# Patient Record
Sex: Female | Born: 1950 | Race: Black or African American | Hispanic: No | Marital: Married | Smoking: Never smoker
Health system: Southern US, Community
[De-identification: ages and names within clinical notes are randomized; demographics above are authoritative.]

---

## 1997-07-19 ENCOUNTER — Other Ambulatory Visit: Admission: RE | Admit: 1997-07-19 | Discharge: 1997-07-19 | Payer: Self-pay | Admitting: *Deleted

## 1998-06-25 ENCOUNTER — Other Ambulatory Visit: Admission: RE | Admit: 1998-06-25 | Discharge: 1998-06-25 | Payer: Self-pay | Admitting: *Deleted

## 2001-08-23 ENCOUNTER — Other Ambulatory Visit: Admission: RE | Admit: 2001-08-23 | Discharge: 2001-08-23 | Payer: Self-pay | Admitting: *Deleted

## 2002-09-05 ENCOUNTER — Other Ambulatory Visit: Admission: RE | Admit: 2002-09-05 | Discharge: 2002-09-05 | Payer: Self-pay | Admitting: *Deleted

## 2004-02-27 ENCOUNTER — Ambulatory Visit: Payer: Self-pay | Admitting: Internal Medicine

## 2004-02-28 ENCOUNTER — Encounter: Admission: RE | Admit: 2004-02-28 | Discharge: 2004-02-28 | Payer: Self-pay | Admitting: Internal Medicine

## 2004-07-02 ENCOUNTER — Ambulatory Visit: Payer: Self-pay | Admitting: Internal Medicine

## 2004-07-16 ENCOUNTER — Ambulatory Visit: Payer: Self-pay | Admitting: Internal Medicine

## 2005-07-28 ENCOUNTER — Emergency Department (HOSPITAL_COMMUNITY): Admission: EM | Admit: 2005-07-28 | Discharge: 2005-07-28 | Payer: Self-pay | Admitting: Emergency Medicine

## 2008-09-25 ENCOUNTER — Ambulatory Visit (HOSPITAL_COMMUNITY): Admission: RE | Admit: 2008-09-25 | Discharge: 2008-09-25 | Payer: Self-pay | Admitting: Orthopedic Surgery

## 2010-04-30 ENCOUNTER — Ambulatory Visit (INDEPENDENT_AMBULATORY_CARE_PROVIDER_SITE_OTHER): Payer: Self-pay | Admitting: Family Medicine

## 2010-04-30 DIAGNOSIS — Z713 Dietary counseling and surveillance: Secondary | ICD-10-CM

## 2013-01-03 ENCOUNTER — Other Ambulatory Visit (HOSPITAL_COMMUNITY)
Admission: RE | Admit: 2013-01-03 | Discharge: 2013-01-03 | Disposition: A | Discharge status: Home / Self Care | Payer: 59 | Source: Ambulatory Visit | Attending: Family Medicine | Admitting: Family Medicine

## 2013-01-03 ENCOUNTER — Other Ambulatory Visit: Payer: Self-pay | Admitting: Family Medicine

## 2013-01-03 DIAGNOSIS — Z1151 Encounter for screening for human papillomavirus (HPV): Secondary | ICD-10-CM | POA: Insufficient documentation

## 2013-01-03 DIAGNOSIS — Z124 Encounter for screening for malignant neoplasm of cervix: Secondary | ICD-10-CM | POA: Insufficient documentation

## 2013-01-17 ENCOUNTER — Encounter: Payer: Self-pay | Admitting: Internal Medicine

## 2013-01-17 ENCOUNTER — Other Ambulatory Visit (HOSPITAL_COMMUNITY): Payer: Self-pay | Admitting: Family Medicine

## 2013-01-17 ENCOUNTER — Ambulatory Visit (HOSPITAL_COMMUNITY): Payer: 59 | Attending: Internal Medicine | Admitting: Radiology

## 2013-01-17 DIAGNOSIS — I079 Rheumatic tricuspid valve disease, unspecified: Secondary | ICD-10-CM | POA: Insufficient documentation

## 2013-01-17 DIAGNOSIS — I359 Nonrheumatic aortic valve disorder, unspecified: Secondary | ICD-10-CM

## 2013-01-17 DIAGNOSIS — R011 Cardiac murmur, unspecified: Secondary | ICD-10-CM | POA: Insufficient documentation

## 2013-01-17 DIAGNOSIS — E785 Hyperlipidemia, unspecified: Secondary | ICD-10-CM | POA: Insufficient documentation

## 2013-01-17 NOTE — Progress Notes (Signed)
Echocardiogram performed.  

## 2013-01-26 ENCOUNTER — Telehealth: Payer: Self-pay | Admitting: Cardiology

## 2013-01-26 NOTE — Telephone Encounter (Signed)
New problem:  Pt states she is a patient of Dr. Anne Fu and she would like someone to call her with her echo results.

## 2013-01-26 NOTE — Telephone Encounter (Signed)
Echo results were reviewed with pt/ questions were asked and answered. Pt verbalized understanding.

## 2013-07-30 ENCOUNTER — Encounter: Payer: 59 | Attending: Family Medicine | Admitting: *Deleted

## 2013-07-30 DIAGNOSIS — E669 Obesity, unspecified: Secondary | ICD-10-CM | POA: Insufficient documentation

## 2013-07-30 DIAGNOSIS — Z713 Dietary counseling and surveillance: Secondary | ICD-10-CM | POA: Insufficient documentation

## 2013-07-30 NOTE — Progress Notes (Signed)
Patient was seen on 07/30/2013 for the Weight Loss Class at the [facility]. The following learning objectives were met by the patient during this class:   Describe healthy choices in each food group  Describe portion size of foods  Use plate method for meal planning  Demonstrate how to read Nutrition Facts food label  Set realistic goals for weight loss, diet changes, and physical activity.   Goals:  1. Make healthy food choices in each food group.  2. Reduce portion size of foods.  3. Increase fruit and vegetable intake.  4. Use plate method for meal planning.  5. Increase physical activity.   Handouts given:  1. Weight loss tips 2. Meal plan/portion card 2. Plate method  2. Food label handout

## 2015-03-07 MED FILL — TIMOLOL 0.5% EYE DROPS: 0.5 | 25 days supply | Qty: 5 | Fill #1

## 2015-03-07 MED FILL — TRAVATAN Z 0.004% EYE DROP: 0.004 | 50 days supply | Qty: 5 | Fill #2

## 2015-04-09 MED FILL — TIMOLOL 0.5% EYE DROPS: 0.5 | 25 days supply | Qty: 5 | Fill #0

## 2015-05-18 MED FILL — TIMOLOL 0.5% EYE DROPS: 0.5 | 25 days supply | Qty: 5 | Fill #1

## 2015-06-01 DIAGNOSIS — H401131 Primary open-angle glaucoma, bilateral, mild stage: Secondary | ICD-10-CM | POA: Diagnosis not present

## 2015-06-01 DIAGNOSIS — H26493 Other secondary cataract, bilateral: Secondary | ICD-10-CM | POA: Diagnosis not present

## 2015-06-01 DIAGNOSIS — Z961 Presence of intraocular lens: Secondary | ICD-10-CM | POA: Diagnosis not present

## 2015-06-15 MED FILL — TIMOLOL 0.5% EYE DROPS: 0.5 | 25 days supply | Qty: 5 | Fill #2

## 2015-06-15 MED FILL — TRAVATAN Z 0.004% EYE DROP: 0.004 | 50 days supply | Qty: 5 | Fill #3

## 2015-07-06 DIAGNOSIS — H0289 Other specified disorders of eyelid: Secondary | ICD-10-CM | POA: Diagnosis not present

## 2015-07-06 DIAGNOSIS — H401131 Primary open-angle glaucoma, bilateral, mild stage: Secondary | ICD-10-CM | POA: Diagnosis not present

## 2015-07-26 ENCOUNTER — Ambulatory Visit (INDEPENDENT_AMBULATORY_CARE_PROVIDER_SITE_OTHER): Payer: 59

## 2015-07-26 ENCOUNTER — Encounter: Payer: Self-pay | Admitting: Podiatry

## 2015-07-26 ENCOUNTER — Ambulatory Visit (INDEPENDENT_AMBULATORY_CARE_PROVIDER_SITE_OTHER): Payer: 59 | Admitting: Podiatry

## 2015-07-26 VITALS — BP 150/83 | HR 84 | Resp 16 | Ht 60.0 in | Wt 158.0 lb

## 2015-07-26 DIAGNOSIS — M6740 Ganglion, unspecified site: Secondary | ICD-10-CM

## 2015-07-26 DIAGNOSIS — M79671 Pain in right foot: Secondary | ICD-10-CM | POA: Diagnosis not present

## 2015-07-26 NOTE — Progress Notes (Signed)
Subjective:     Patient ID: Erika Sanchez, female   DOB: 06-Jul-1950, 65 y.o.   MRN: [id number]  HPI patient points to the fourth digit right foot stating that there is a cyst that's been present for a little while and larger over the last several weeks. Concerned about what it may be and what could be done   Review of Systems  All other systems reviewed and are negative.      Objective:   Physical Exam  Constitutional: She is oriented to person, place, and time.  Cardiovascular: Intact distal pulses.   Musculoskeletal: Normal range of motion.  Neurological: She is oriented to person, place, and time.  Skin: Skin is warm.  Nursing note and vitals reviewed.  neurovascular status intact with muscle strength adequate and range of motion within normal limits. Patient's noted to have an inflamed area on the interphalangeal joint distal fourth toe right medial side that measures about 3 x 3 mm with fluid buildup noted. Patient is noted to have good digital perfusion and is well oriented 3     Assessment:     Probable mucoid cyst with ganglionic-like fluid fourth digit right foot    Plan:     H&P and x-ray reviewed. Infiltrated the right fourth toe 60 mg Xylocaine Marcaine mixture and after appropriate numbness aspirated the area getting out a small amount of gelatinous fluid and injected with a quarter cc of dexamethasone. Went ahead and applied compression and instructed on compressing this the next 4 weeks and reappoint to recheck  X-ray report indicates that there is slight narrowing of the interphalangeal joint fourth digit right but good alignment

## 2015-07-26 NOTE — Progress Notes (Signed)
   Subjective:    Patient ID: Erika Sanchez, female    DOB: [birth year], 65 y.o.   MRN: [id number]  HPI Chief Complaint  Patient presents with  . Toe Pain    Right foot; 4th toe; pt stated, "has hard nodule on top of toe; thinks might be a cyst"; x2 weeks     Review of Systems  All other systems reviewed and are negative.      Objective:   Physical Exam        Assessment & Plan:

## 2015-07-27 ENCOUNTER — Telehealth: Payer: Self-pay | Admitting: *Deleted

## 2015-07-27 NOTE — Telephone Encounter (Addendum)
Pt states she is having severe throbbing after the procedure Dr. Charlsie Merlesegal performed yesterday.  Dr. Charlsie Merlesegal states remove the compression, and take OTC antiinflammatory medication or Tylenol as directed if she tolerates.  Pt states understanding.

## 2015-08-03 MED FILL — TRAVATAN Z 0.004% EYE DROP: 0.004 | 24 days supply | Qty: 3 | Fill #4

## 2015-08-03 MED FILL — TIMOLOL 0.5% EYE DROPS: 0.5 | 25 days supply | Qty: 5 | Fill #3

## 2015-08-10 ENCOUNTER — Telehealth: Payer: Self-pay | Admitting: *Deleted

## 2015-08-10 NOTE — Telephone Encounter (Signed)
Pt states she had an I & D on 4th toe and it is still numb. Informed pt that was not unusual in the lesser toes, and asked how the toe looked. Pt states looked okay.  I told her since she had an infection it would be best to be checked again, and transferred to scheduler.

## 2015-08-16 ENCOUNTER — Ambulatory Visit (INDEPENDENT_AMBULATORY_CARE_PROVIDER_SITE_OTHER): Payer: 59 | Admitting: Podiatry

## 2015-08-16 ENCOUNTER — Encounter: Payer: Self-pay | Admitting: Podiatry

## 2015-08-16 VITALS — BP 148/90 | HR 92 | Resp 12

## 2015-08-16 DIAGNOSIS — M6740 Ganglion, unspecified site: Secondary | ICD-10-CM | POA: Diagnosis not present

## 2015-08-16 DIAGNOSIS — M79671 Pain in right foot: Secondary | ICD-10-CM

## 2015-08-17 NOTE — Progress Notes (Signed)
Subjective:     Patient ID: Erika Sanchez, female   DOB: [birth year], 65 y.o.   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 If symptoms persist over the next 3 months she will be seen back

## 2015-08-31 MED FILL — TIMOLOL 0.5% EYE DROPS: 0.5 | 25 days supply | Qty: 5 | Fill #0

## 2015-08-31 MED FILL — TRAVATAN Z 0.004% EYE DROP: 0.004 | 25 days supply | Qty: 3 | Fill #0

## 2015-10-01 MED FILL — TRAVATAN Z 0.004% EYE DROP: 0.004 | 25 days supply | Qty: 3 | Fill #1

## 2015-10-01 MED FILL — TIMOLOL 0.5% EYE DROPS: 0.5 | 25 days supply | Qty: 5 | Fill #1

## 2015-10-31 MED FILL — TRAVATAN Z 0.004% EYE DROP: 0.004 | 50 days supply | Qty: 5 | Fill #2

## 2015-10-31 MED FILL — TIMOLOL 0.5% EYE DROPS: 0.5 | 50 days supply | Qty: 10 | Fill #2

## 2015-12-28 MED FILL — TIMOLOL 0.5% EYE DROPS: 0.5 | 50 days supply | Qty: 10 | Fill #3

## 2015-12-31 DIAGNOSIS — L239 Allergic contact dermatitis, unspecified cause: Secondary | ICD-10-CM | POA: Diagnosis not present

## 2015-12-31 MED FILL — TRAVATAN Z 0.004% EYE DROP: 0.004 | 50 days supply | Qty: 5 | Fill #3

## 2016-03-07 MED FILL — TIMOLOL 0.5% EYE DROPS: 0.5 | 50 days supply | Qty: 10 | Fill #4

## 2016-03-14 MED FILL — LATANOPROST 0.005% EYE DRP: 0.005 | 25 days supply | Qty: 3 | Fill #0

## 2016-04-09 MED FILL — LATANOPROST 0.005% EYE DRP: 0.005 | 23 days supply | Qty: 3 | Fill #0

## 2016-04-28 MED FILL — TIMOLOL 0.5% EYE DROPS: 0.5 | 50 days supply | Qty: 10 | Fill #0

## 2016-04-28 MED FILL — LATANOPROST 0.005% EYE DRP: 0.005 | 23 days supply | Qty: 3 | Fill #1

## 2016-06-04 MED FILL — LATANOPROST 0.005% EYE DRP: 0.005 | 23 days supply | Qty: 3 | Fill #2

## 2016-06-24 MED FILL — LATANOPROST 0.005% EYE DRP: 0.005 | 23 days supply | Qty: 3 | Fill #3

## 2016-06-24 MED FILL — TIMOLOL 0.5% EYE DROPS: 0.5 | 50 days supply | Qty: 10 | Fill #1

## 2016-07-27 MED FILL — LATANOPROST 0.005% EYE DRP: 0.005 | 25 days supply | Qty: 3 | Fill #4

## 2016-08-14 MED FILL — TIMOLOL 0.5% EYE DROPS: 0.5 | 50 days supply | Qty: 10 | Fill #2

## 2016-08-30 MED FILL — LATANOPROST 0.005% EYE DRP: 0.005 | 25 days supply | Qty: 3 | Fill #5

## 2016-09-24 MED FILL — LATANOPROST 0.005% EYE DRP: 0.005 | 25 days supply | Qty: 3 | Fill #6

## 2016-09-24 MED FILL — TIMOLOL 0.5% EYE DROPS: 0.5 | 50 days supply | Qty: 10 | Fill #3

## 2016-10-29 MED FILL — LATANOPROST 0.005% EYE DRP: 0.005 | 25 days supply | Qty: 3 | Fill #0

## 2016-11-18 MED FILL — TIMOLOL 0.5% EYE DROPS: 0.5 | 50 days supply | Qty: 10 | Fill #4

## 2016-11-25 MED FILL — LATANOPROST 0.005% EYE DRP: 0.005 | 25 days supply | Qty: 3 | Fill #1

## 2016-12-26 MED FILL — LATANOPROST 0.005% EYE DRP: 0.005 | 25 days supply | Qty: 3 | Fill #2

## 2017-01-07 MED FILL — TIMOLOL 0.5% EYE DROPS: 0.5 | 50 days supply | Qty: 10 | Fill #5

## 2017-01-27 MED FILL — LATANOPROST 0.005% EYE DRP: 0.005 | 25 days supply | Qty: 3 | Fill #3

## 2017-02-23 MED FILL — LATANOPROST 0.005% EYE DRP: 0.005 | 25 days supply | Qty: 3 | Fill #4

## 2017-02-23 MED FILL — TIMOLOL 0.5% EYE DROPS: 0.5 | 50 days supply | Qty: 10 | Fill #6

## 2017-03-24 MED FILL — LATANOPROST 0.005% EYE DRP: 0.005 | 25 days supply | Qty: 3 | Fill #5

## 2017-04-22 MED FILL — LATANOPROST 0.005% EYE DRP: 0.005 | 25 days supply | Qty: 3 | Fill #6

## 2017-05-11 MED FILL — LATANOPROST 0.005% EYE DRP: 0.005 | 25 days supply | Qty: 3 | Fill #0

## 2017-06-09 MED FILL — LATANOPROST 0.005% EYE DRP: 0.005 | 25 days supply | Qty: 3 | Fill #1

## 2017-07-07 MED FILL — LATANOPROST 0.005% EYE DRP: 0.005 | 25 days supply | Qty: 3 | Fill #2

## 2017-08-06 MED FILL — LATANOPROST 0.005% EYE DRP: 0.005 | 25 days supply | Qty: 3 | Fill #3

## 2017-09-01 MED FILL — LATANOPROST 0.005% EYE DRP: 0.005 | 25 days supply | Qty: 3 | Fill #4

## 2017-09-29 MED FILL — LATANOPROST 0.005% EYE DRP: 0.005 | 25 days supply | Qty: 3 | Fill #5

## 2017-10-30 MED FILL — LATANOPROST 0.005% EYE DRP: 0.005 | 25 days supply | Qty: 3 | Fill #6

## 2017-11-10 DIAGNOSIS — H401122 Primary open-angle glaucoma, left eye, moderate stage: Secondary | ICD-10-CM | POA: Diagnosis not present

## 2018-01-16 ENCOUNTER — Encounter (HOSPITAL_COMMUNITY): Payer: Self-pay

## 2018-01-16 ENCOUNTER — Ambulatory Visit (HOSPITAL_COMMUNITY)
Admission: EM | Admit: 2018-01-16 | Discharge: 2018-01-16 | Disposition: A | Discharge status: Home / Self Care | Payer: Medicare HMO | Attending: Family Medicine | Admitting: Family Medicine

## 2018-01-16 DIAGNOSIS — H9311 Tinnitus, right ear: Secondary | ICD-10-CM

## 2018-01-16 NOTE — ED Triage Notes (Signed)
Pt presents with a humming/buzzing sound in right ear, she believes may be an insect; no complaints of pain.

## 2018-01-16 NOTE — Discharge Instructions (Addendum)
If this continues, you may want to call an ear, nose, and throat (ENT) specialist as well as an audiologist for further workup.

## 2018-02-09 NOTE — ED Provider Notes (Addendum)
  [facility]   [id number] 01/16/18 Arrival Time: 1433  ASSESSMENT & PLAN:  1. Subjective tinnitus, right    Normal ear exam. No signs of foreign body, infection, or injury seen. Discussed. Recommend that she f/u with ENT/audiology. She will self-schedule. . May f/u with PCP or here as needed.  Reviewed expectations re: course of current medical issues. Questions answered. Outlined signs and symptoms indicating need for more acute intervention. Patient verbalized understanding. After Visit Summary given.   SUBJECTIVE: History from: patient.  Erika NearingMinnie W Sanchez is a 67 y.o. female who presents with complaint of "a humming or ringing in my right ear"; without drainage or pain; without bleeding. Onset gradual, unsure of when this started; notices it randomly. Recent cold symptoms: none. Fever: no. Overall normal PO intake without n/v. Sick contacts: no. OTC treatment: none. No new medications since this has started. No foreign bodies inserted into ears. No specific hearing loss or sensitivity to loud sounds reported.  Social History   Tobacco Use  Smoking Status Never Smoker   ROS: As per HPI.   OBJECTIVE:  Vitals:   01/16/18 1514  BP: 137/73  Pulse: 75  Resp: 20  Temp: 98 F (36.7 C)  TempSrc: Oral  SpO2: 98%     General appearance: alert; appears fatigued Ear Canal: normal bilaterally TM: bilateral: with normal appearance Neck: supple without LAD Lungs: unlabored respirations, symmetrical air entry; cough: absent; no respiratory distress Skin: warm and dry Psychological: alert and cooperative; normal mood and affect  Allergies  Allergen Reactions  . Keflex [Cephalexin] Hives  . Sulfa Antibiotics Hives   FH: no hearing problems reported  Social History   Socioeconomic History  . Marital status: Married    Spouse name: Not on file  . Number of children: Not on file  . Years of education: Not on file  . Highest education level: Not on file    Occupational History  . Not on file  Social Needs  . Financial resource strain: Not on file  . Food insecurity:    Worry: Not on file    Inability: Not on file  . Transportation needs:    Medical: Not on file    Non-medical: Not on file  Tobacco Use  . Smoking status: Never Smoker  Substance and Sexual Activity  . Alcohol use: Not on file  . Drug use: Not on file  . Sexual activity: Not on file  Lifestyle  . Physical activity:    Days per week: Not on file    Minutes per session: Not on file  . Stress: Not on file  Relationships  . Social connections:    Talks on phone: Not on file    Gets together: Not on file    Attends religious service: Not on file    Active member of club or organization: Not on file    Attends meetings of clubs or organizations: Not on file    Relationship status: Not on file  . Intimate partner violence:    Fear of current or ex partner: Not on file    Emotionally abused: Not on file    Physically abused: Not on file    Forced sexual activity: Not on file  Other Topics Concern  . Not on file  Social History Narrative  . Not on file            Mardella LaymanHagler, Cylah Fannin, MD 02/09/18 [id number]    Mardella LaymanHagler, Camilla Skeen, MD 02/09/18 [phone]

## 2018-03-02 DIAGNOSIS — H401111 Primary open-angle glaucoma, right eye, mild stage: Secondary | ICD-10-CM | POA: Diagnosis not present

## 2018-03-02 DIAGNOSIS — H401122 Primary open-angle glaucoma, left eye, moderate stage: Secondary | ICD-10-CM | POA: Diagnosis not present

## 2018-05-19 DIAGNOSIS — R5383 Other fatigue: Secondary | ICD-10-CM | POA: Diagnosis not present

## 2018-05-19 DIAGNOSIS — Z79899 Other long term (current) drug therapy: Secondary | ICD-10-CM | POA: Diagnosis not present

## 2018-05-19 DIAGNOSIS — E7800 Pure hypercholesterolemia, unspecified: Secondary | ICD-10-CM | POA: Diagnosis not present

## 2018-05-19 DIAGNOSIS — I499 Cardiac arrhythmia, unspecified: Secondary | ICD-10-CM | POA: Diagnosis not present

## 2018-06-17 DIAGNOSIS — I499 Cardiac arrhythmia, unspecified: Secondary | ICD-10-CM | POA: Diagnosis not present

## 2018-07-02 DIAGNOSIS — I499 Cardiac arrhythmia, unspecified: Secondary | ICD-10-CM | POA: Diagnosis not present

## 2018-07-02 DIAGNOSIS — R002 Palpitations: Secondary | ICD-10-CM | POA: Diagnosis not present

## 2018-07-16 DIAGNOSIS — I499 Cardiac arrhythmia, unspecified: Secondary | ICD-10-CM | POA: Diagnosis not present

## 2020-06-13 DIAGNOSIS — M170 Bilateral primary osteoarthritis of knee: Secondary | ICD-10-CM | POA: Diagnosis not present

## 2020-06-25 DIAGNOSIS — M170 Bilateral primary osteoarthritis of knee: Secondary | ICD-10-CM | POA: Diagnosis not present

## 2020-06-25 DIAGNOSIS — M7052 Other bursitis of knee, left knee: Secondary | ICD-10-CM | POA: Diagnosis not present

## 2020-06-25 DIAGNOSIS — M6281 Muscle weakness (generalized): Secondary | ICD-10-CM | POA: Diagnosis not present

## 2020-06-28 DIAGNOSIS — M25562 Pain in left knee: Secondary | ICD-10-CM | POA: Diagnosis not present

## 2020-07-06 DIAGNOSIS — M25562 Pain in left knee: Secondary | ICD-10-CM | POA: Diagnosis not present

## 2020-07-16 DIAGNOSIS — M25562 Pain in left knee: Secondary | ICD-10-CM | POA: Diagnosis not present

## 2020-07-19 DIAGNOSIS — M25561 Pain in right knee: Secondary | ICD-10-CM | POA: Diagnosis not present

## 2020-07-19 DIAGNOSIS — M25562 Pain in left knee: Secondary | ICD-10-CM | POA: Diagnosis not present

## 2020-07-24 ENCOUNTER — Other Ambulatory Visit: Payer: Self-pay

## 2020-07-24 ENCOUNTER — Ambulatory Visit: Payer: Medicare HMO | Attending: Internal Medicine

## 2020-07-24 ENCOUNTER — Other Ambulatory Visit (HOSPITAL_BASED_OUTPATIENT_CLINIC_OR_DEPARTMENT_OTHER): Payer: Self-pay

## 2020-07-24 DIAGNOSIS — M25562 Pain in left knee: Secondary | ICD-10-CM | POA: Diagnosis not present

## 2020-07-24 DIAGNOSIS — Z23 Encounter for immunization: Secondary | ICD-10-CM

## 2020-07-24 MED ORDER — COVID-19 MRNA VACC (MODERNA) 100 MCG/0.5ML IM SUSP
INTRAMUSCULAR | 0 refills | Status: AC
  Filled 2020-07-24: qty 0.25, 1d supply, fill #0

## 2020-07-24 NOTE — Progress Notes (Signed)
   Covid-19 Vaccination Clinic  Name:  Erika Sanchez    MRN: [id number] DOB: [birth year]  07/24/2020  [name] was observed post Covid-19 immunization for 15 minutes without incident. She was provided with Vaccine Information Sheet and instruction to access the V-Safe system.   [name] was instructed to call 911 with any severe reactions post vaccine: Marland Kitchen Difficulty breathing  . Swelling of face and throat  . A fast heartbeat  . A bad rash all over body  . Dizziness and weakness   Immunizations Administered    Name Date Dose VIS Date Route   Moderna Covid-19 Booster Vaccine 07/24/2020  2:33 PM 0.25 mL 12/14/2019 Intramuscular   Manufacturer: Moderna   Lot: [id number]   NDC: 49675-916-38

## 2020-09-27 DIAGNOSIS — Z131 Encounter for screening for diabetes mellitus: Secondary | ICD-10-CM | POA: Diagnosis not present

## 2020-09-27 DIAGNOSIS — Z0000 Encounter for general adult medical examination without abnormal findings: Secondary | ICD-10-CM | POA: Diagnosis not present

## 2020-09-27 DIAGNOSIS — E559 Vitamin D deficiency, unspecified: Secondary | ICD-10-CM | POA: Diagnosis not present

## 2020-09-27 DIAGNOSIS — Z79899 Other long term (current) drug therapy: Secondary | ICD-10-CM | POA: Diagnosis not present

## 2020-09-27 DIAGNOSIS — R300 Dysuria: Secondary | ICD-10-CM | POA: Diagnosis not present

## 2020-09-27 DIAGNOSIS — Z113 Encounter for screening for infections with a predominantly sexual mode of transmission: Secondary | ICD-10-CM | POA: Diagnosis not present

## 2020-09-27 DIAGNOSIS — E7800 Pure hypercholesterolemia, unspecified: Secondary | ICD-10-CM | POA: Diagnosis not present

## 2020-09-27 DIAGNOSIS — R0602 Shortness of breath: Secondary | ICD-10-CM | POA: Diagnosis not present

## 2020-09-27 DIAGNOSIS — R5383 Other fatigue: Secondary | ICD-10-CM | POA: Diagnosis not present

## 2020-09-27 DIAGNOSIS — Z1159 Encounter for screening for other viral diseases: Secondary | ICD-10-CM | POA: Diagnosis not present

## 2020-10-08 ENCOUNTER — Other Ambulatory Visit: Payer: Self-pay | Admitting: Physician Assistant

## 2020-10-08 DIAGNOSIS — Z1231 Encounter for screening mammogram for malignant neoplasm of breast: Secondary | ICD-10-CM

## 2020-10-11 DIAGNOSIS — R7303 Prediabetes: Secondary | ICD-10-CM | POA: Diagnosis not present

## 2020-10-11 DIAGNOSIS — E559 Vitamin D deficiency, unspecified: Secondary | ICD-10-CM | POA: Diagnosis not present

## 2020-10-11 DIAGNOSIS — E7800 Pure hypercholesterolemia, unspecified: Secondary | ICD-10-CM | POA: Diagnosis not present

## 2020-10-11 DIAGNOSIS — L819 Disorder of pigmentation, unspecified: Secondary | ICD-10-CM | POA: Diagnosis not present

## 2020-10-18 ENCOUNTER — Other Ambulatory Visit: Payer: Self-pay | Admitting: Adult Medicine

## 2020-10-18 DIAGNOSIS — Z780 Asymptomatic menopausal state: Secondary | ICD-10-CM

## 2020-11-06 ENCOUNTER — Ambulatory Visit
Admission: RE | Admit: 2020-11-06 | Discharge: 2020-11-06 | Disposition: A | Discharge status: Home / Self Care | Payer: Medicare HMO | Source: Ambulatory Visit | Attending: Adult Medicine | Admitting: Adult Medicine

## 2020-11-06 ENCOUNTER — Other Ambulatory Visit: Payer: Self-pay

## 2020-11-06 DIAGNOSIS — Z780 Asymptomatic menopausal state: Secondary | ICD-10-CM | POA: Diagnosis not present

## 2020-11-07 ENCOUNTER — Other Ambulatory Visit: Payer: Self-pay | Admitting: Adult Medicine

## 2020-11-07 DIAGNOSIS — Z1231 Encounter for screening mammogram for malignant neoplasm of breast: Secondary | ICD-10-CM

## 2020-11-08 ENCOUNTER — Other Ambulatory Visit: Payer: Self-pay

## 2020-11-08 ENCOUNTER — Ambulatory Visit
Admission: RE | Admit: 2020-11-08 | Discharge: 2020-11-08 | Disposition: A | Discharge status: Home / Self Care | Payer: Medicare HMO | Source: Ambulatory Visit | Attending: Adult Medicine | Admitting: Adult Medicine

## 2020-11-08 DIAGNOSIS — Z1231 Encounter for screening mammogram for malignant neoplasm of breast: Secondary | ICD-10-CM | POA: Diagnosis not present

## 2020-11-10 DIAGNOSIS — Z1211 Encounter for screening for malignant neoplasm of colon: Secondary | ICD-10-CM | POA: Diagnosis not present

## 2020-11-19 DIAGNOSIS — Z1211 Encounter for screening for malignant neoplasm of colon: Secondary | ICD-10-CM | POA: Diagnosis not present

## 2021-09-18 DIAGNOSIS — H401122 Primary open-angle glaucoma, left eye, moderate stage: Secondary | ICD-10-CM | POA: Diagnosis not present

## 2021-09-18 DIAGNOSIS — H401111 Primary open-angle glaucoma, right eye, mild stage: Secondary | ICD-10-CM | POA: Diagnosis not present

## 2021-10-17 DIAGNOSIS — E7800 Pure hypercholesterolemia, unspecified: Secondary | ICD-10-CM | POA: Diagnosis not present

## 2021-10-17 DIAGNOSIS — R5383 Other fatigue: Secondary | ICD-10-CM | POA: Diagnosis not present

## 2021-10-17 DIAGNOSIS — M129 Arthropathy, unspecified: Secondary | ICD-10-CM | POA: Diagnosis not present

## 2021-10-17 DIAGNOSIS — Z0000 Encounter for general adult medical examination without abnormal findings: Secondary | ICD-10-CM | POA: Diagnosis not present

## 2021-10-17 DIAGNOSIS — E559 Vitamin D deficiency, unspecified: Secondary | ICD-10-CM | POA: Diagnosis not present

## 2021-10-17 DIAGNOSIS — Z79899 Other long term (current) drug therapy: Secondary | ICD-10-CM | POA: Diagnosis not present

## 2021-10-17 DIAGNOSIS — Z131 Encounter for screening for diabetes mellitus: Secondary | ICD-10-CM | POA: Diagnosis not present

## 2021-12-27 DIAGNOSIS — Z961 Presence of intraocular lens: Secondary | ICD-10-CM | POA: Diagnosis not present

## 2021-12-27 DIAGNOSIS — H401122 Primary open-angle glaucoma, left eye, moderate stage: Secondary | ICD-10-CM | POA: Diagnosis not present

## 2022-05-13 DIAGNOSIS — H401111 Primary open-angle glaucoma, right eye, mild stage: Secondary | ICD-10-CM | POA: Diagnosis not present

## 2022-08-06 IMAGING — MG MM DIGITAL SCREENING BILAT W/ TOMO AND CAD
8 series · 8 of 24 positions shown · non-contrast
Comparison: None.

CLINICAL DATA: Screening.

EXAM:
DIGITAL SCREENING BILATERAL MAMMOGRAM WITH TOMOSYNTHESIS AND CAD
TECHNIQUE: Bilateral screening digital craniocaudal and mediolateral oblique
mammograms were obtained. Bilateral screening digital breast
tomosynthesis was performed. The images were evaluated with
computer-aided detection.

[R MLO synth-2D]
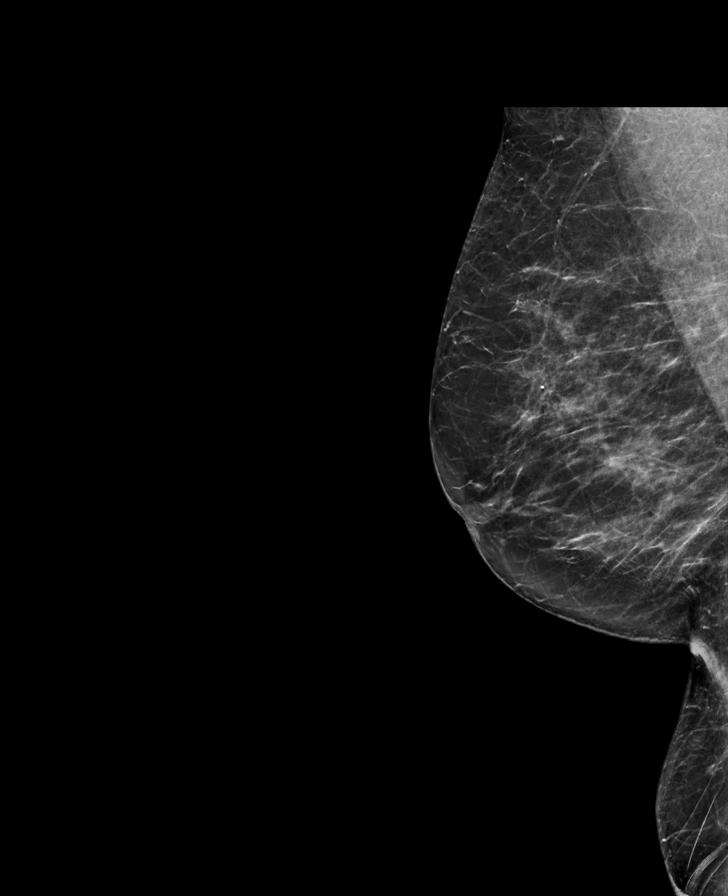

[R CC synth-2D]
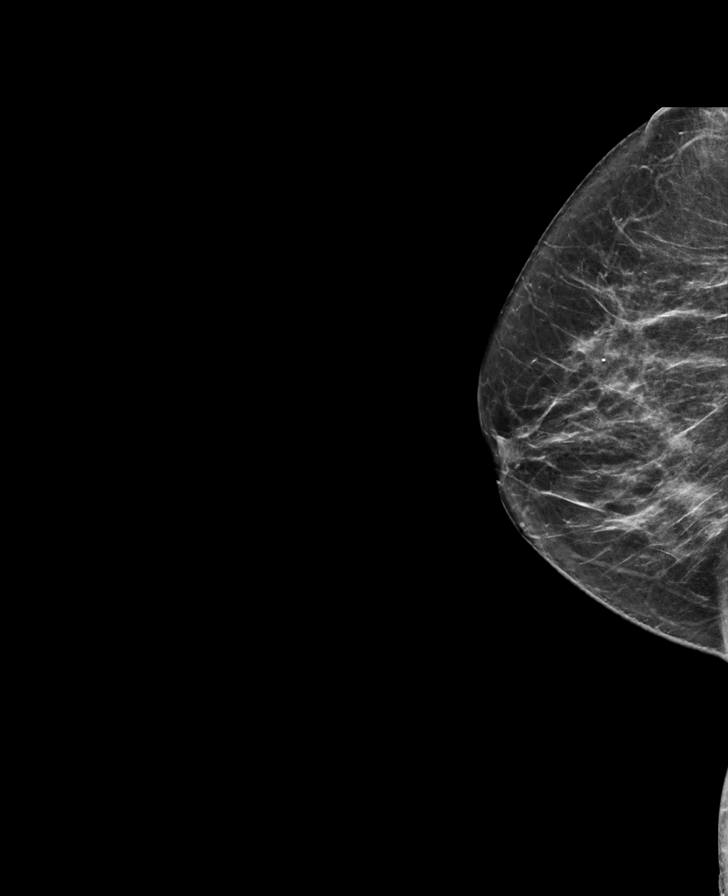

[L MLO synth-2D]
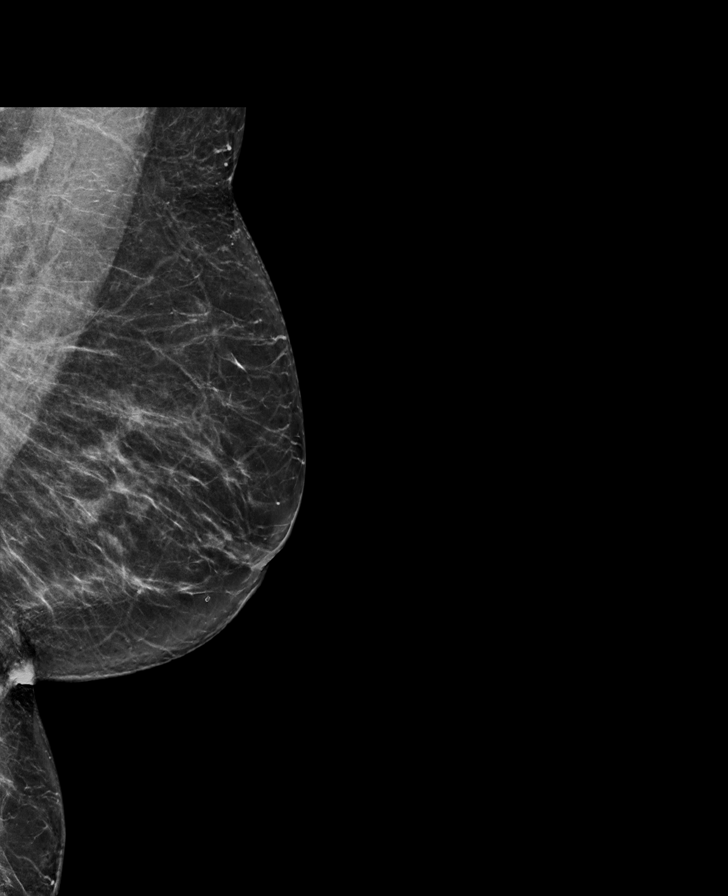

[L CC synth-2D]
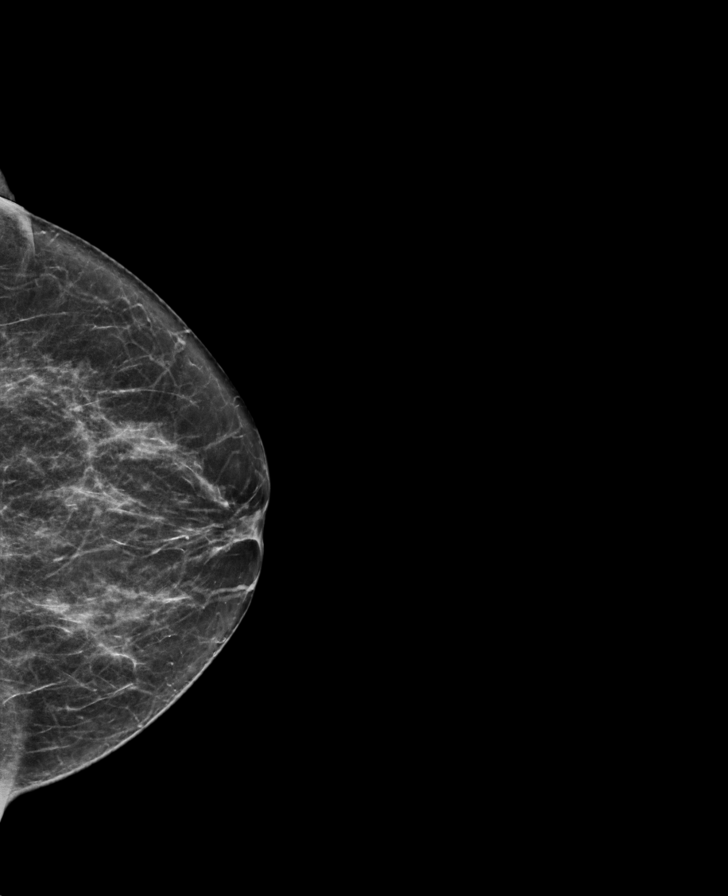

[L MLO tomo · tomo slice 37/72.0]
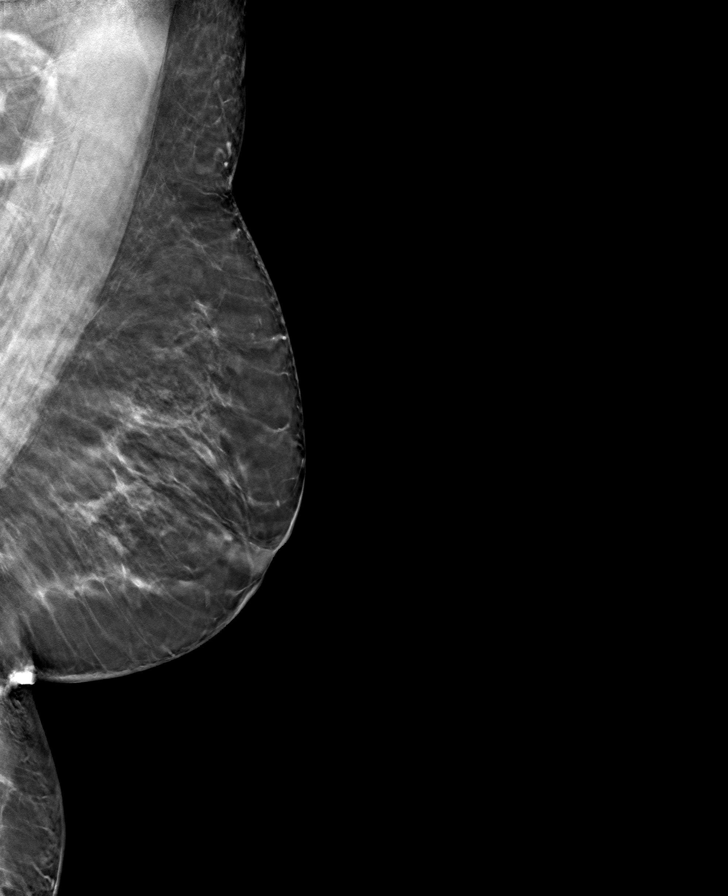

[L CC tomo · tomo slice 33/64.0]
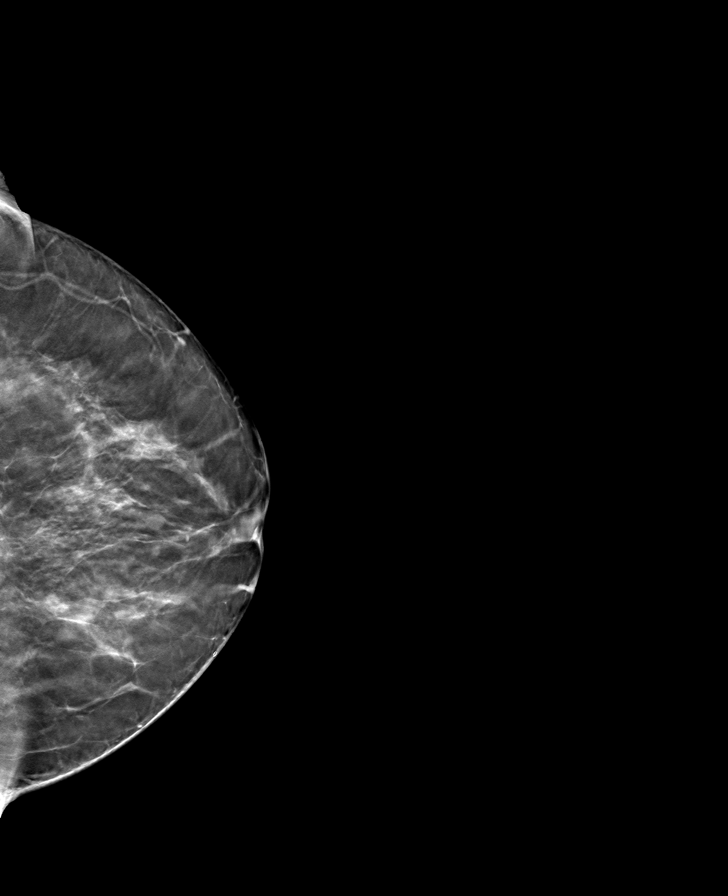

[R CC tomo · tomo slice 34/67.0]
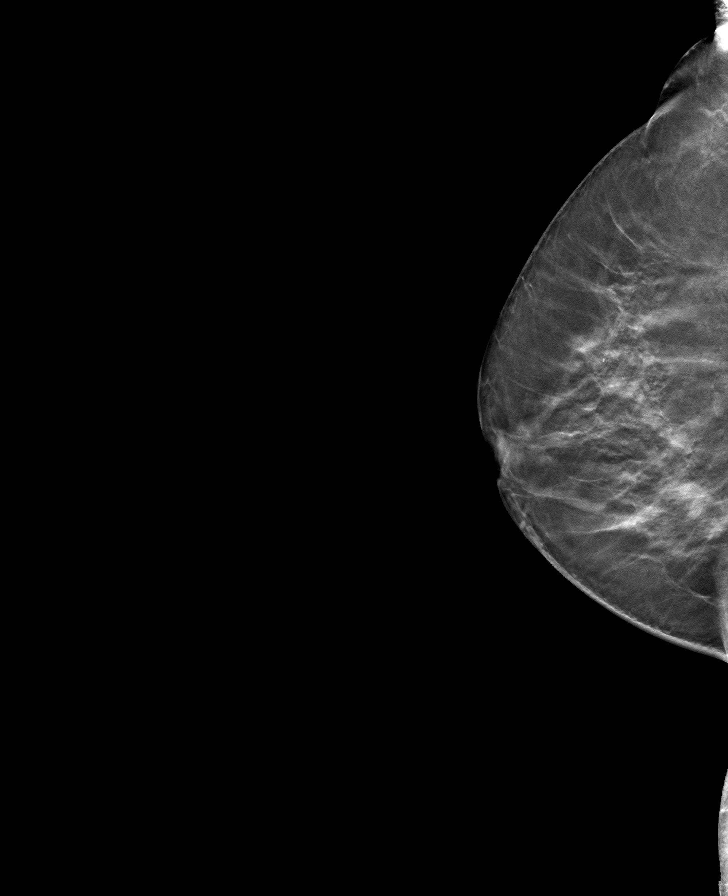

[R MLO tomo · tomo slice 35/70.0]
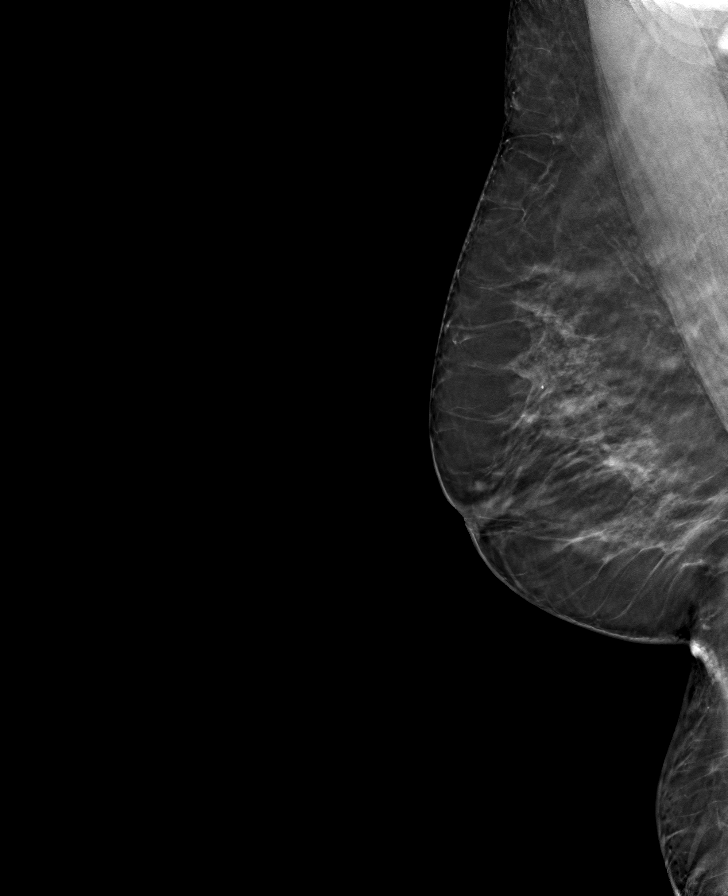

[8 of 24 positions shown; findings below may reference images not displayed]

ACR Breast Density Category b: There are scattered areas of
fibroglandular density.
FINDINGS: There are no findings suspicious for malignancy.
IMPRESSION: No mammographic evidence of malignancy. A result letter of this
screening mammogram will be mailed directly to the patient.

RECOMMENDATION:
Screening mammogram in one year. (Code:XG-X-X7B)

BI-RADS CATEGORY  1: Negative.

## 2022-11-14 DIAGNOSIS — H401111 Primary open-angle glaucoma, right eye, mild stage: Secondary | ICD-10-CM | POA: Diagnosis not present

## 2022-12-04 DIAGNOSIS — Z6828 Body mass index (BMI) 28.0-28.9, adult: Secondary | ICD-10-CM | POA: Diagnosis not present

## 2022-12-04 DIAGNOSIS — Z0130 Encounter for examination of blood pressure without abnormal findings: Secondary | ICD-10-CM | POA: Diagnosis not present

## 2022-12-04 DIAGNOSIS — E7800 Pure hypercholesterolemia, unspecified: Secondary | ICD-10-CM | POA: Diagnosis not present

## 2022-12-04 DIAGNOSIS — E559 Vitamin D deficiency, unspecified: Secondary | ICD-10-CM | POA: Diagnosis not present

## 2022-12-04 DIAGNOSIS — Z0000 Encounter for general adult medical examination without abnormal findings: Secondary | ICD-10-CM | POA: Diagnosis not present

## 2022-12-04 DIAGNOSIS — R5383 Other fatigue: Secondary | ICD-10-CM | POA: Diagnosis not present

## 2022-12-04 DIAGNOSIS — Z789 Other specified health status: Secondary | ICD-10-CM | POA: Diagnosis not present

## 2022-12-04 DIAGNOSIS — Z1159 Encounter for screening for other viral diseases: Secondary | ICD-10-CM | POA: Diagnosis not present

## 2022-12-04 DIAGNOSIS — Z131 Encounter for screening for diabetes mellitus: Secondary | ICD-10-CM | POA: Diagnosis not present

## 2022-12-07 DIAGNOSIS — Z789 Other specified health status: Secondary | ICD-10-CM | POA: Diagnosis not present

## 2022-12-07 DIAGNOSIS — E7800 Pure hypercholesterolemia, unspecified: Secondary | ICD-10-CM | POA: Diagnosis not present

## 2022-12-07 DIAGNOSIS — R7303 Prediabetes: Secondary | ICD-10-CM | POA: Diagnosis not present

## 2022-12-07 DIAGNOSIS — M549 Dorsalgia, unspecified: Secondary | ICD-10-CM | POA: Diagnosis not present

## 2022-12-07 DIAGNOSIS — Z6826 Body mass index (BMI) 26.0-26.9, adult: Secondary | ICD-10-CM | POA: Diagnosis not present

## 2022-12-07 DIAGNOSIS — Z0130 Encounter for examination of blood pressure without abnormal findings: Secondary | ICD-10-CM | POA: Diagnosis not present

## 2022-12-10 DIAGNOSIS — Z1231 Encounter for screening mammogram for malignant neoplasm of breast: Secondary | ICD-10-CM | POA: Diagnosis not present

## 2023-10-21 ENCOUNTER — Other Ambulatory Visit (HOSPITAL_COMMUNITY): Payer: Self-pay | Admitting: Internal Medicine

## 2023-10-21 DIAGNOSIS — R079 Chest pain, unspecified: Secondary | ICD-10-CM

## 2023-11-02 ENCOUNTER — Telehealth (HOSPITAL_COMMUNITY): Payer: Self-pay | Admitting: *Deleted

## 2023-11-02 MED ORDER — METOPROLOL TARTRATE 50 MG PO TABS: ORAL_TABLET | ORAL | 0 refills | Status: AC

## 2023-11-02 NOTE — Telephone Encounter (Signed)
 Attempted to call patient regarding upcoming cardiac CT appointment. Left message on voicemail with name and callback number  Larey Brick RN Navigator Cardiac Imaging [facility] [phone] Office [phone] Cell

## 2023-11-02 NOTE — Telephone Encounter (Signed)
 Patient returning call about her upcoming cardiac imaging study; pt verbalizes understanding of appt date/time, parking situation and where to check in, pre-test NPO status and medications ordered, and verified current allergies; name and call back number provided for further questions should they arise  Larey Brick RN Navigator Cardiac Imaging [facility] [phone] office [phone] cell  Patient to take 50mg  metoprolol tartrate two hours prior to her cardiac CT scan.

## 2023-11-03 ENCOUNTER — Ambulatory Visit (HOSPITAL_COMMUNITY)
Admission: RE | Admit: 2023-11-03 | Discharge: 2023-11-03 | Disposition: A | Discharge status: Home / Self Care | Source: Ambulatory Visit | Attending: Internal Medicine | Admitting: Internal Medicine

## 2023-11-03 DIAGNOSIS — R079 Chest pain, unspecified: Secondary | ICD-10-CM | POA: Diagnosis not present

## 2023-11-03 MED ORDER — NITROGLYCERIN 0.4 MG SL SUBL
0.8000 mg | SUBLINGUAL_TABLET | Freq: Once | SUBLINGUAL | Status: AC
  Administered 2023-11-03: 0.8 mg via SUBLINGUAL

## 2023-11-03 MED ORDER — IOHEXOL 350 MG/ML SOLN
100.0000 mL | Freq: Once | INTRAVENOUS | Status: AC | PRN
  Administered 2023-11-03: 100 mL via INTRAVENOUS
